# Patient Record
Sex: Female | Born: 1980 | Hispanic: Yes | Marital: Married | Smoking: Never smoker
Health system: Southern US, Community
[De-identification: ages and names within clinical notes are randomized; demographics above are authoritative.]

---

## 2013-12-19 ENCOUNTER — Encounter (HOSPITAL_COMMUNITY): Payer: Self-pay | Admitting: Emergency Medicine

## 2013-12-19 ENCOUNTER — Emergency Department (HOSPITAL_COMMUNITY)
Admission: EM | Admit: 2013-12-19 | Discharge: 2013-12-19 | Disposition: A | Discharge status: Home / Self Care | Payer: Medicaid - Out of State | Attending: Emergency Medicine | Admitting: Emergency Medicine

## 2013-12-19 ENCOUNTER — Emergency Department (HOSPITAL_COMMUNITY): Payer: Medicaid - Out of State

## 2013-12-19 DIAGNOSIS — R740 Nonspecific elevation of levels of transaminase and lactic acid dehydrogenase [LDH]: Secondary | ICD-10-CM

## 2013-12-19 DIAGNOSIS — R748 Abnormal levels of other serum enzymes: Secondary | ICD-10-CM | POA: Insufficient documentation

## 2013-12-19 DIAGNOSIS — K8020 Calculus of gallbladder without cholecystitis without obstruction: Secondary | ICD-10-CM | POA: Insufficient documentation

## 2013-12-19 DIAGNOSIS — R7401 Elevation of levels of liver transaminase levels: Secondary | ICD-10-CM | POA: Insufficient documentation

## 2013-12-19 DIAGNOSIS — R7402 Elevation of levels of lactic acid dehydrogenase (LDH): Secondary | ICD-10-CM | POA: Insufficient documentation

## 2013-12-19 LAB — COMPREHENSIVE METABOLIC PANEL
ALK PHOS: 106 U/L (ref 39–117)
ALT: 246 U/L — AB (ref 0–35)
AST: 404 U/L — ABNORMAL HIGH (ref 0–37)
Albumin: 3.9 g/dL (ref 3.5–5.2)
BUN: 13 mg/dL (ref 6–23)
CALCIUM: 9.8 mg/dL (ref 8.4–10.5)
CO2: 24 mEq/L (ref 19–32)
Chloride: 101 mEq/L (ref 96–112)
Creatinine, Ser: 0.65 mg/dL (ref 0.50–1.10)
GFR calc non Af Amer: >90 mL/min (ref >90)
GLUCOSE: 143 mg/dL — AB (ref 70–99)
POTASSIUM: 3.9 meq/L (ref 3.7–5.3)
Sodium: 139 mEq/L (ref 137–147)
Total Bilirubin: 0.5 mg/dL (ref 0.3–1.2)
Total Protein: 7.6 g/dL (ref 6.0–8.3)

## 2013-12-19 LAB — CBC WITH DIFFERENTIAL/PLATELET
BASOS ABS: 0 10*3/uL (ref 0.0–0.1)
BASOS PCT: 0 % (ref 0–1)
EOS PCT: 2 % (ref 0–5)
Eosinophils Absolute: 0.1 10*3/uL (ref 0.0–0.7)
HEMATOCRIT: 31.5 % — AB (ref 36.0–46.0)
HEMOGLOBIN: 9.9 g/dL — AB (ref 12.0–15.0)
Lymphocytes Relative: 26 % (ref 12–46)
Lymphs Abs: 1.4 10*3/uL (ref 0.7–4.0)
MCH: 26 pg (ref 26.0–34.0)
MCHC: 31.4 g/dL (ref 30.0–36.0)
MCV: 82.7 fL (ref 78.0–100.0)
Monocytes Absolute: 0.4 10*3/uL (ref 0.1–1.0)
Monocytes Relative: 8 % (ref 3–12)
Neutro Abs: 3.5 10*3/uL (ref 1.7–7.7)
Neutrophils Relative %: 64 % (ref 43–77)
Platelets: 188 10*3/uL (ref 150–400)
RBC: 3.81 MIL/uL — ABNORMAL LOW (ref 3.87–5.11)
RDW: 17.5 % — AB (ref 11.5–15.5)
WBC: 5.4 10*3/uL (ref 4.0–10.5)

## 2013-12-19 LAB — URINALYSIS, ROUTINE W REFLEX MICROSCOPIC
BILIRUBIN URINE: NEGATIVE
GLUCOSE, UA: NEGATIVE mg/dL
HGB URINE DIPSTICK: NEGATIVE
Ketones, ur: NEGATIVE mg/dL
Nitrite: POSITIVE — AB
PROTEIN: NEGATIVE mg/dL
Specific Gravity, Urine: 1.02 (ref 1.005–1.030)
UROBILINOGEN UA: 1 mg/dL (ref 0.0–1.0)
pH: 8.5 — ABNORMAL HIGH (ref 5.0–8.0)

## 2013-12-19 LAB — URINE MICROSCOPIC-ADD ON

## 2013-12-19 LAB — LIPASE, BLOOD: Lipase: 90 U/L — ABNORMAL HIGH (ref 11–59)

## 2013-12-19 MED ORDER — ONDANSETRON HCL 4 MG/2ML IJ SOLN
4.0000 mg | Freq: Once | INTRAMUSCULAR | Status: AC
  Administered 2013-12-19: 4 mg via INTRAVENOUS
  Filled 2013-12-19: qty 2

## 2013-12-19 MED ORDER — OXYCODONE HCL 5 MG PO TABS
ORAL_TABLET | ORAL | Status: AC
Start: 2013-12-19 — End: ?

## 2013-12-19 MED ORDER — ONDANSETRON 8 MG PO TBDP: 8.0000 mg | ORAL_TABLET | Freq: Three times a day (TID) | ORAL | Status: AC | PRN

## 2013-12-19 MED ORDER — SODIUM CHLORIDE 0.9 % IV SOLN
Freq: Once | INTRAVENOUS | Status: AC
  Administered 2013-12-19: 05:00:00 via INTRAVENOUS

## 2013-12-19 MED ORDER — MORPHINE SULFATE 4 MG/ML IJ SOLN
4.0000 mg | Freq: Once | INTRAMUSCULAR | Status: AC
  Administered 2013-12-19: 4 mg via INTRAVENOUS
  Filled 2013-12-19: qty 1

## 2013-12-19 NOTE — Discharge Instructions (Signed)
Gallbladder  Take medications as prescribed.  Call the surgery clinic later today to schedule a follow up visit for your gallstones.  Return to the ER for worsening pain, vomiting despite medications, fever, or other new concerning symptoms.   PAIN ACETAMINOPHEN OXYCODONE  PAIN ACETAMINOPHEN OXYCODONE: You have been given a medication that contains acetaminophen and oxycodone.      This medication is used to relieve pain.     DO NOT take this medication if you have liver disease or drink alcohol on a daily basis.     DO NOT take this medication if you are taking other over-the-counter medications that contain Tylenol or acetaminophen (the active ingredient in Tylenol).     If you have side-effects that you think are caused by this medicine, tell your doctor.     DO NOT drink alcoholic beverages while taking this medicine.     If you become dizzy, sit or lie down at the first signs.  You should be careful going up and down stairs.     If you are pregnant or breastfeeding, notify your doctor before taking this medication.     Keep this medication out of the reach of children.  Always keep this medication in child-proof containers.  DO NOT give your medication to anyone else. This medication can be HABIT-FORMING.  Discontinue use when no longer needed and never give this medication to others.  You have been given a medication, or a prescription for a medication, that causes drowsiness or dizziness.  DO NOT drive a car, operate machinery, or perform jobs that require you to be alert until you know how you are going to react to this medicine.  THESE INSTRUCTIONS ARE NOT COMPREHENSIVE (complete):  Ask your pharmacist for additional information and precautions for this medication.   GI ANTIEMETIC  GI ANTIEMETIC: You have been given a prescription for a medication for nausea and vomiting.      It is OK to take this medication if you are pregnant.  Be sure to tell your regular doctor or  obstetrician [facility] doctor) that you have been taking this medication.     Take this medication as directed.     If you are taking phenobarbital, narcotic pain medications, antidepressants, or sleeping pills your dosage may need to be adjusted.  Be sure to inform your doctor of all the other medications that you are taking.     DO NOT take this medication if you have liver disease or heart disease.     DO NOT take pain killers (narcotic medication) unless specifically instructed to do so by your doctor     DO NOT drink alcoholic beverages while taking this medicine.     If you develop any reactions that you believe may be from the medication be sure to tell your doctor or return to the ER (Some reactions may include:  dizziness, shaking, visual disturbances, nervousness, fainting, rash).     If you become dizzy, sit or lie down at the first signs.  You should be careful going up and down stairs.     Keep this medication out of the reach of children.  Always keep this medication in child-proof containers.  DO NOT give your medication to anyone else. You have been given a medication, or a prescription for a medication, that causes drowsiness or dizziness.  DO NOT drive a car, operate machinery, ride a bike, or perform jobs that require you to be alert until you know how you  are going to react to this medication. ° °THESE INSTRUCTIONS ARE NOT COMPREHENSIVE (complete):  Ask your pharmacist for additional information and precautions for this medication. ° ° °LOW FAT DIET ° °Low Fat Diet ° °Your doctor wants you to be on a low fat diet.  This diet will be helpful if you want to lose weight or if you have problems with your liver, pancreas, gallbladder.  °FOODS ALLOWED: ° ·  Beverages: All, except those not allowed.   Evaporated skim milk.  ° ·  Breads: All enriched or whole grain bread, bread sticks, graham crackers, melba toast, pretzels, rye wafers, matzoh, saltines, bagels.  ° ·  Cereals: All cooked  without fat or dry.  ° ·  Desserts: All fruit, diet puddings, gelatin, dessert made with egg white, angel food cake, fruit ice, sherbet.  ° ·  Eggs: Not more than one egg yolk daily, whites okay.  Cholesterol free egg substitutes, such as "egg beaters."  ° ·  Fat: One teaspoon each meal or 3 teaspoons per day; butter, mayonnaise, margarine, oil. (May be used in cooking if omitted at meals) Fat free salad dressings and gravy.  ° ·  Fruits: All fresh, frozen, or canned fruit or fruit juice. One citrus fruit every day.  ° ·  Meats, Fish, Poultry & Cheese: Remove visible fat from meat before cooking. Baked, broiled, boiled, roasted, stewed, simmered; lean fish, meat, poultry, seafood.  Water packed salmon and tuna.  Lowfat cottage cheese, skim milk cheese, ricotta, parmesan, farmer's cheese, lowfat yogurt and tofu.  ° ·  Potatoes & Substitutes: Macaroni, noodles, rice, spaghetti, sweet or white potato; prepared without fat, unless used in amount allowed.  ° ·  Soups: Bouillon, cream soups made with vegetables and skim milk, fat free meat and poultry soups.  ° ·  Sweets: Honey, jam, jelly, marshmallows, molasses, sugar, syrup, candies; hard, Life Savers, gum drops, jelly beans, sour balls.  ° ·  Vegetables: All fresh, frozen, canned or juiced vegetables allowed.  °FOODS NOT ALLOWED: ° ·  Beverages: Cream, 2% or whole milk, chocolate milk, condensed milk, evaporated or malted milk or shakes, 1/2 & 1/2.  ° ·  Breads: Quick breads, muffins, biscuits, pancakes, corn bread, sweet rolls, any fried breads.  ° ·  Cereals: Bran, if it causes distress, wheat germ.  ° ·  Desserts: Dessert made with whole milk, cream, butter, lard, oil, coconut, nuts, or chocolate.  ° ·  Eggs: Eggs prepared with whole milk or fat. Fried eggs.  ° ·  Fat: More than 1 teaspoon per meal.  Bacon, bacon fat, ham fat, lard, salt pork, shortening, gravy, salad dressings, non-dairy creamers.  ° ·  Fruits: [place].  Any fruit that causes distress.   ° ·  Meats, Fish, Poultry & Cheese: All fried or fatty.  Sausage, prime rib, frankfurters, luncheon meats, fish canned in oil, duck, goose, poultry skin, spiced or pickled meats, cheese (except those allowed), whole milk yogurt.  Peanut butter limited to 1 Tbs. day.  ° ·  Potatoes & Substitutes: Cooked with fat or oil, fried potatoes, potato chips, cream sauces (unless made with skim milk).  ° ·  Sweets: Chocolate, coconut, nuts, caramels.  °          ·    Vegetables: Avocado, any cooked in fat or that cause distress.  ° ° °BILIARY COLIC ° °BILIARY COLIC: °You have been diagnosed with biliary colic. ° °Biliary colic is the term used to describe crampy pain from   a gallbladder that contains gallstones. ° °The gallbladder is a small sack that hangs from the liver. It stores a liquid called bile. Bile is produced by the liver. When you eat, the gallbladder squeezes bile through a duct or tube into the intestines to help with the digestion of fat. ° °Gallstones develop from crystals of bile. The stone may be smaller than a pea, or as large as a golf ball. There may be one or several stones. The stone may block the tube that drains the bile from the gallbladder. This blockage causes spasm of the gallbladder and pain.  The pain usually comes and goes and is crampy.  It often starts after eating foods that contain a lot of fat. ° °You may also have nausea and vomiting with the pain. ° °Biliary colic is usually treated with pain medications. You may also be given a medication for the nausea and vomiting. ° °You should avoid eating foods that are fried or contain a lot of fat. ° °If you have more episodes of pain, you may need to have your gall bladder removed. ° ° ·   You should contact your family doctor for a referral to a general surgeon. °YOU SHOULD SEEK MEDICAL ATTENTION IMMEDIATELY, EITHER HERE OR AT THE NEAREST EMERGENCY DEPARTMENT, IF ANY OF THE FOLLOWING OCCURS: ° ° ·   Increasing pain or pain that does not go  away. ° ·   Persistent vomiting or if you are not able to keep any fluids down. ° ·   Fever or shaking chills. ° ·   Yellowing of your skin or eyes, or dark, brown-colored urine. ° °If you develop symptoms of Shortness of Breath, Chest Pain, Swelling of lips, mouth or tongue or if your condition becomes worse with any new symptoms, see your doctor or return to the Emergency Department for immediate care. Emergency services are not intended to be a substitute for comprehensive medical attention.  Please contact your doctor for follow up if not improving as expected.  ° °Call your doctor in 5-7 days or as directed if there is no improvement.  ° °Community Resources: °*IF YOU ARE IN IMMEDIATE DANGER CALL 911! ° °Abuse/Neglect:  °Family Services Crisis Hotline ([place]): (336) 273-7273 °[facility] Violence ([place]): (336) 342-3331 ° After hours, holidays and weekends: (336) 643-3000 °National Domestic Violence Hotline: 800 799-7233 ° °Mental Health: °[place] [facility]: °[address]: (336) 641-4993 ° °Health Clinics:  °[facility] ( [facility]): (336) 832-4400 °Monday - Friday 8 AM - 9 PM, Saturday and Sunday 10 AM - 9 PM ° °[facility] °[address]: ([phone]) [phone] Monday - Friday 8 AM - 5 PM ° °[facility]  °[address]: [phone]) [phone] Monday- Friday 8:30 AM - 5:30 PM, Sat 9 AM - 1 PM ° °[facility] °[facility] [address]: ([phone]) [phone] °[facility] [address]: (336) [phone] °[facility] on [address]: (336) 854-7827 ° °[facility] °[facility]: [address] [phone]) [phone] ° °Cultures: °If culture results are positive, we will notify you if a change in treatment is necessary. ° °LABORATORY TESTS: °        If you had any labs drawn in the ED that have not resulted by the time you are discharged home, we will review these lab results and the treatment given to you.  If there is any further treatment or notification needed,  we will contact you by phone, or letter.  "PLEASE   ENSURE THAT YOU HAVE GIVEN US YOUR CURRENT WORKING PHONE NUMBER AND YOUR CURRENT ADDRESS, so that we can contact you if needed."  RADIOLOGY TESTS:  If the referred physician wants todays x-rays, please call the hospitals Radiology Department the day before your doctors appointment. [facility]     [phone] [facility]   [phone] [facility]     [phone]  Our doctors and staff appreciate your choosing us for your emergency medical care needs. We are here to serve you.   Cholelithiasis Cholelithiasis (also called gallstones) is a form of gallbladder disease in which gallstones form in your gallbladder. The gallbladder is an organ that stores bile made in the liver, which helps digest fats. Gallstones begin as small crystals and slowly grow into stones. Gallstone pain occurs when the gallbladder spasms and a gallstone is blocking the duct. Pain can also occur when a stone passes out of the duct.  RISK FACTORS  Being female.   Having multiple pregnancies. Health care providers sometimes advise removing diseased gallbladders before future pregnancies.   Being obese.  Eating a diet heavy in fried foods and fat.   Being older than 60 years and increasing age.   Prolonged use of medicines containing female hormones.   Having diabetes mellitus.   Rapidly losing weight.   Having a family history of gallstones (heredity).  SYMPTOMS  Nausea.   Vomiting.  Abdominal pain.   Yellowing of the skin (jaundice).   Sudden pain. It may persist from several minutes to several hours.  Fever.   Tenderness to the touch. In some cases, when gallstones do not move into the bile duct, people have no pain or symptoms. These are called "silent" gallstones.  TREATMENT Silent gallstones do not need treatment. In severe cases, emergency surgery may be required. Options for treatment include:  Surgery to remove the gallbladder. This is  the most common treatment.  Medicines. These do not always work and may take 6 12 months or more to work.  Shock wave treatment (extracorporeal biliary lithotripsy). In this treatment an ultrasound machine sends shock waves to the gallbladder to break gallstones into smaller pieces that can pass into the intestines or be dissolved by medicine. HOME CARE INSTRUCTIONS   Only take over-the-counter or prescription medicines for pain, discomfort, or fever as directed by your health care provider.   Follow a low-fat diet until seen again by your health care provider. Fat causes the gallbladder to contract, which can result in pain.   Follow up with your health care provider as directed. Attacks are almost always recurrent and surgery is usually required for permanent treatment.  SEEK IMMEDIATE MEDICAL CARE IF:   Your pain increases and is not controlled by medicines.   You have a fever or persistent symptoms for more than 2 3 days.   You have a fever and your symptoms suddenly get worse.   You have persistent nausea and vomiting.  MAKE SURE YOU:   Understand these instructions.  Will watch your condition.  Will get help right away if you are not doing well or get worse. Document Released: 08/25/2005 Document Revised: 05/01/2013 Document Reviewed: 02/20/2013 [facility] Patient Information 2014 [employer] [place].

## 2013-12-19 NOTE — ED Notes (Signed)
Pt reports RUQ pain that started yesterday and has gotten worse. Pt reports n/vx3. Pt states she was told she has gallstones before. Pt ambulatory and alert in triage. Family in room with pt.

## 2013-12-19 NOTE — ED Provider Notes (Signed)
CSN: [id number]     Arrival date & time 12/19/13  4098 History   First MD Initiated Contact with Patient 12/19/13 0357     Chief Complaint  Patient presents with  . Abdominal Pain     (Consider location/radiation/quality/duration/timing/severity/associated sxs/prior Treatment) HPI 33 year old female presents to emergency department with complaint of right upper quadrant pain.  Patient reports pain started earlier today, after lunch.  She reports that she had a large meal of barbecue.  Pain is in right upper quadrant and radiates to her right shoulder blade.  She has had nausea and vomiting with it.  Patient was diagnosed about a year and a half ago with gallstones, and has not had any problems since that time.  She does report this past Friday, 6 days ago.  She had one hour of severe right upper quadrant pain that resolved on its own.  Patient lives in [place], reports that she plans to return there tomorrow.  She reports she has a doctor there that she is planning to followup with.  She denies any fever or chills. History reviewed. No pertinent past medical history. History reviewed. No pertinent past surgical history. No family history on file. History  Substance Use Topics  . Smoking status: Never Smoker   . Smokeless tobacco: Not on file  . Alcohol Use: Not on file   OB History   Grav Para Term Preterm Abortions TAB SAB Ect Mult Living                 Review of Systems  See History of Present Illness; otherwise all other systems are reviewed and negative   Allergies  Review of patient's allergies indicates no known allergies.  Home Medications  No current outpatient prescriptions on file. BP 133/93  Pulse 87  Temp(Src) 97.5 F (36.4 C) (Oral)  Resp 20  SpO2 98%  LMP 11/18/2013 Physical Exam  Nursing note and vitals reviewed. Constitutional: She is oriented to person, place, and time. She appears well-developed and well-nourished. She appears distressed.  HENT:   Head: Normocephalic and atraumatic.  Nose: Nose normal.  Mouth/Throat: Oropharynx is clear and moist.  Eyes: Conjunctivae and EOM are normal. Pupils are equal, round, and reactive to light.  Neck: Normal range of motion. Neck supple. No JVD present. No tracheal deviation present. No thyromegaly present.  Cardiovascular: Normal rate, regular rhythm, normal heart sounds and intact distal pulses.  Exam reveals no gallop and no friction rub.   No murmur heard. Pulmonary/Chest: Effort normal and breath sounds normal. No stridor. No respiratory distress. She has no wheezes. She has no rales. She exhibits no tenderness.  Abdominal: Soft. Bowel sounds are normal. She exhibits no distension and no mass. There is tenderness (tenderness in right upper quadrant with Murphy sign noted). There is no rebound and no guarding.  Musculoskeletal: Normal range of motion. She exhibits no edema and no tenderness.  Lymphadenopathy:    She has no cervical adenopathy.  Neurological: She is alert and oriented to person, place, and time. She has normal reflexes. No cranial nerve deficit. She exhibits normal muscle tone. Coordination normal.  Skin: Skin is warm and dry. No rash noted. No erythema. No pallor.  Psychiatric: She has a normal mood and affect. Her behavior is normal. Judgment and thought content normal.    ED Course  Procedures (including critical care time) Labs Review Labs Reviewed  CBC WITH DIFFERENTIAL - Abnormal; Notable for the following:    RBC 3.81 (*)  Hemoglobin 9.9 (*)    HCT 31.5 (*)    RDW 17.5 (*)    All other components within normal limits  LIPASE, BLOOD - Abnormal; Notable for the following:    Lipase 90 (*)    All other components within normal limits  URINALYSIS, ROUTINE W REFLEX MICROSCOPIC - Abnormal; Notable for the following:    Color, Urine ORANGE (*)    APPearance TURBID (*)    pH 8.5 (*)    Nitrite POSITIVE (*)    Leukocytes, UA SMALL (*)    All other components  within normal limits  COMPREHENSIVE METABOLIC PANEL - Abnormal; Notable for the following:    Glucose, Bld 143 (*)    AST 404 (*)    ALT 246 (*)    All other components within normal limits  URINE MICROSCOPIC-ADD ON - Abnormal; Notable for the following:    Bacteria, UA FEW (*)    All other components within normal limits  .now Imaging Review [place] Abdomen Complete  12/19/2013   CLINICAL DATA:  Epigastric pain.  EXAM: ULTRASOUND ABDOMEN COMPLETE  COMPARISON:  None.  FINDINGS: Gallbladder:  There are numerous shadowing gallstones. The wall is mildly thickened, up to 4 mm. There is however no focal gallbladder tenderness (per sonographer exam) or gallbladder distention to suggest cystic duct obstruction. Mild wall thickening could be related to chronic cholecystitis or under distention.  Common bile duct:  Diameter: 6 mm.  Where visualized, no filling defect.  Liver:  No focal lesion identified. Within normal limits in parenchymal echogenicity.  IVC:  No abnormality visualized.  Pancreas:  Visualized portion unremarkable.  Spleen:  Size and appearance within normal limits.  Right Kidney:  Length: 11 cm. Echogenicity within normal limits. No mass or hydronephrosis visualized.  Left Kidney:  Length: 12 cm. Echogenicity within normal limits. No mass or hydronephrosis visualized.  Abdominal aorta:  No aneurysm visualized.  Other findings:  None.  IMPRESSION: Cholelithiasis.  Negative for acute cholecystitis.   Electronically Signed   By: Tiburcio PeaJonathan  Watts M.D.   On: 12/19/2013 07:04     EKG Interpretation None      MDM   Final diagnoses:  Cholelithiasis  Elevated transaminase level  Elevated lipase    33 year old female with known gallstones with what appears to be biliary colic.  Concern for possible cholecystitis, however, so we'll get ultrasound to check for gallbladder wall thickening and pericholecystic fluid.  We'll check labs.  Plan for pain and nausea medicine   7:21 AM Pt reports full  resolution of symptoms.  Labs show mild anemia, no elevated WBC.  She does have elevated lipase and AST/ALT.  U/S with mild thickened gb wall, but no pericholecystic fluid or Murphy sign.  Pt advised given her lab abnormalities she should have semi-emergent surgery here.  She does not wish to stay.  She acknowledges she may worsen.  She is advised to go to the nearest ER if she has recurrent pain, vomiting, fever.  Will give her a copy of her labs and u/s.    Olivia Mackielga M Rajean Desantiago, MD 12/19/13 0730

## 2013-12-21 LAB — URINE CULTURE

## 2015-10-18 IMAGING — US US ABDOMEN COMPLETE
1 series · 14 of 25 positions shown · non-contrast
Comparison: None.

CLINICAL DATA: Epigastric pain.

EXAM:
ULTRASOUND ABDOMEN COMPLETE

[Series 1: us abdomen complete · 0.26mm/px · 49 acquisitions, 14 frames shown]
[im 1/49]
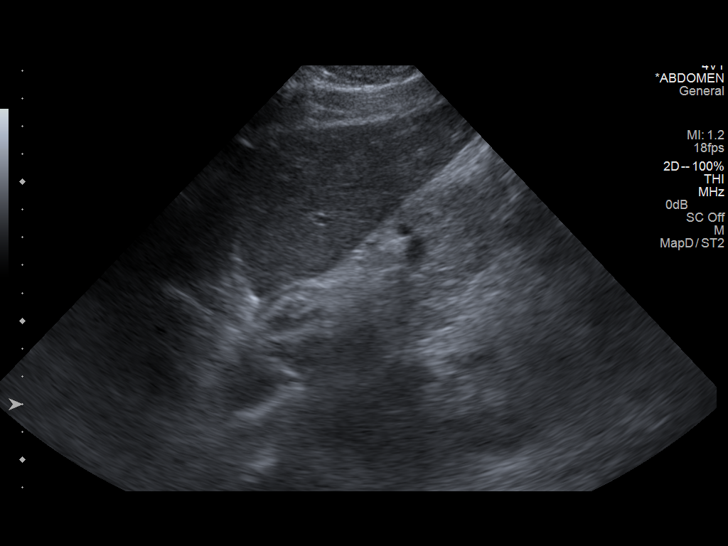
[im 5/49]
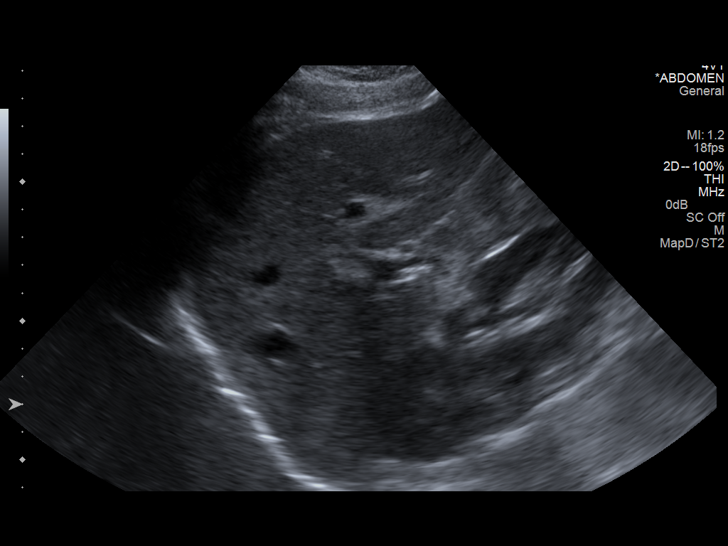
[im 9/49]
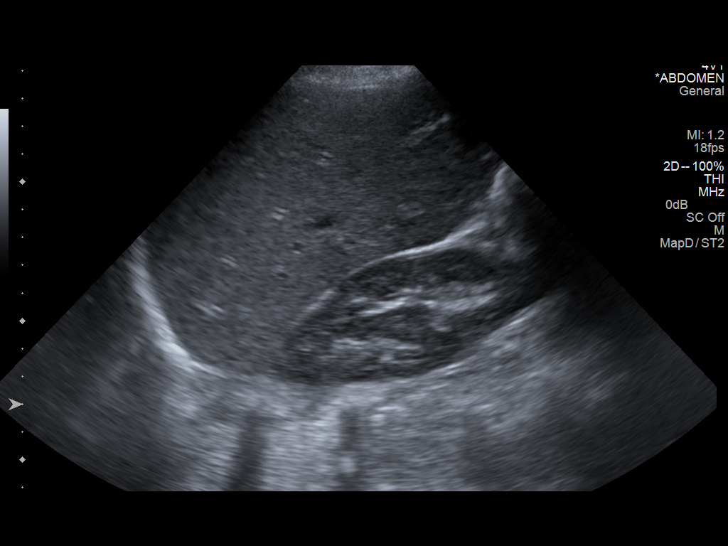
[im 13/49]
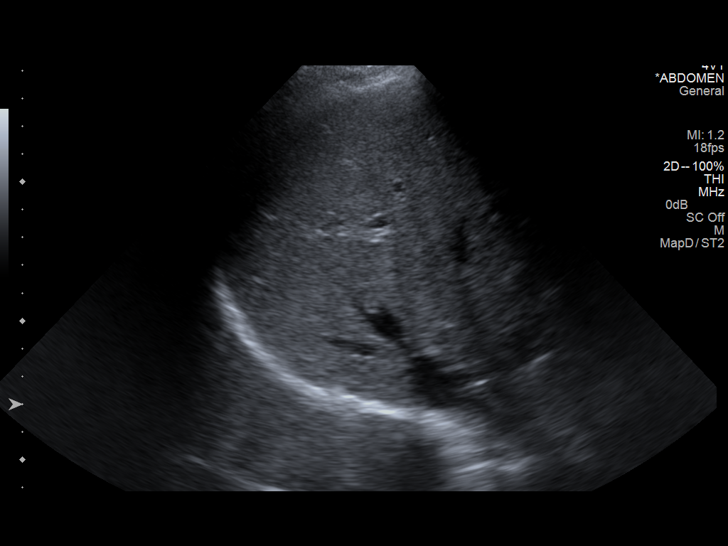
[im 17/49]
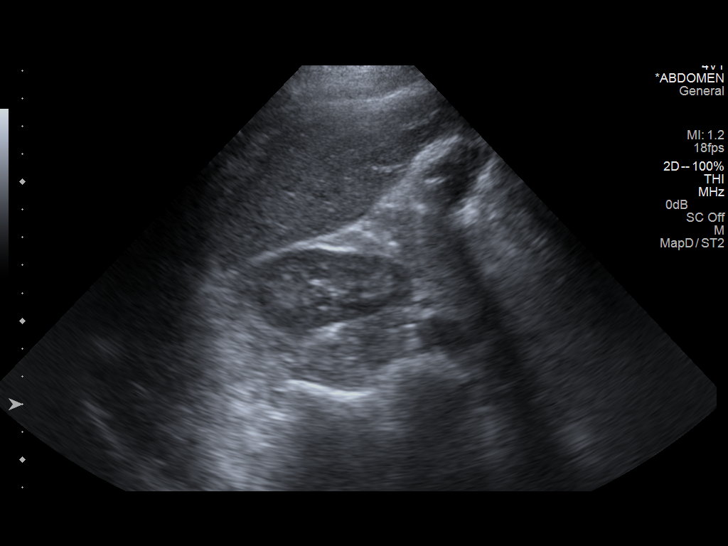
[im 19/49]
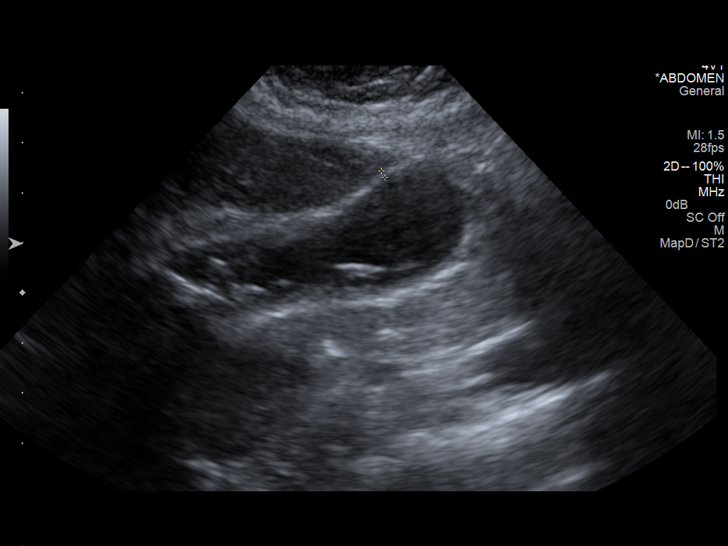
[im 23/49]
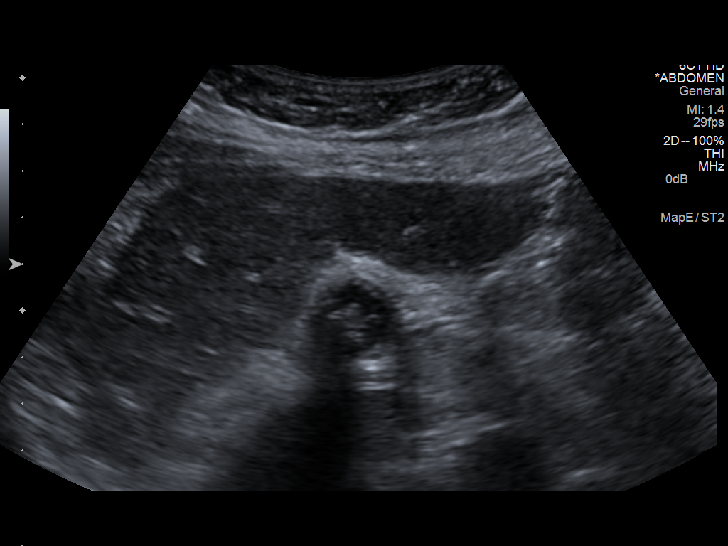
[im 27/49]
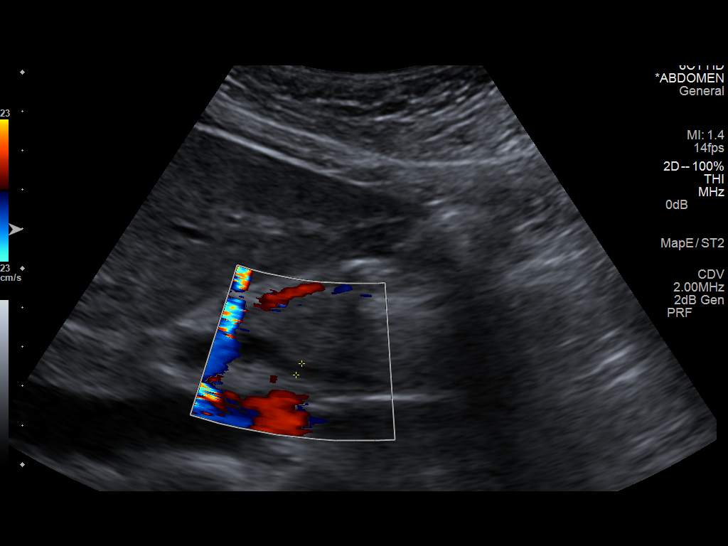
[im 31/49]
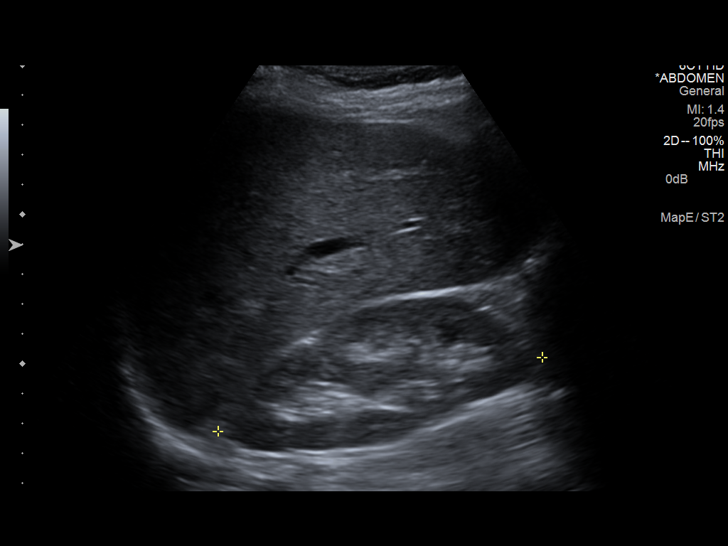
[im 33/49]
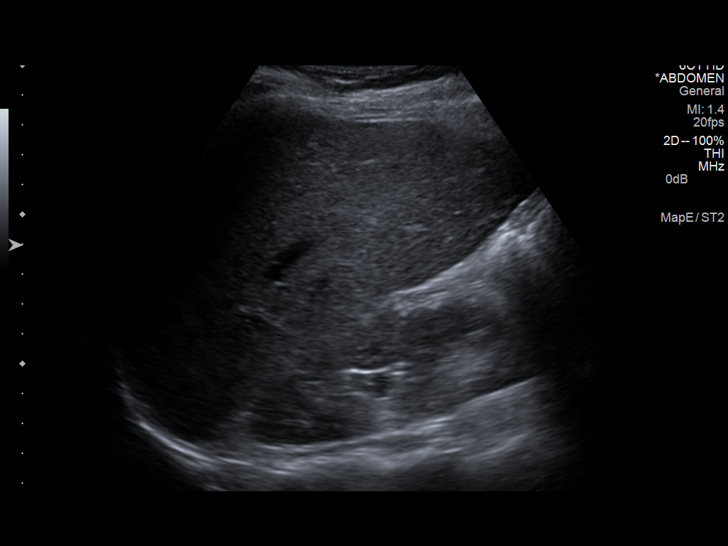
[im 37/49]
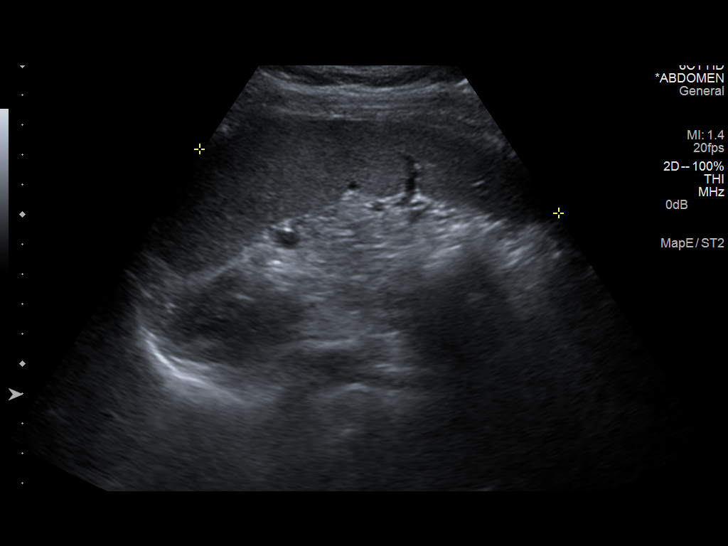
[im 41/49]
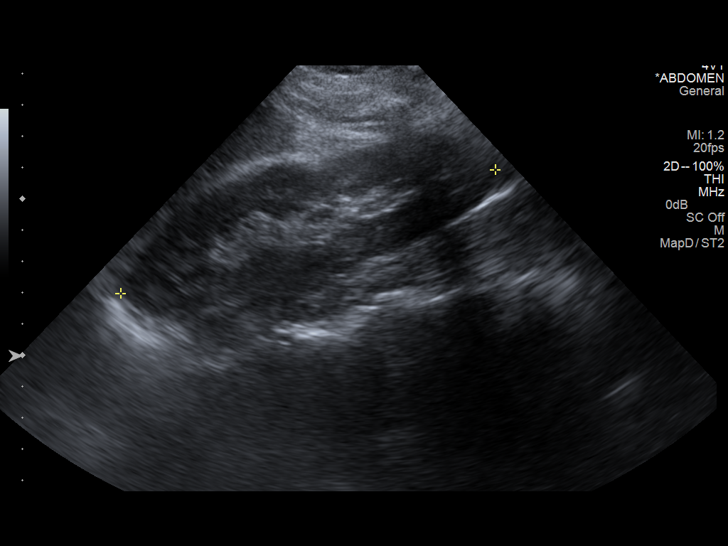
[im 45/49]
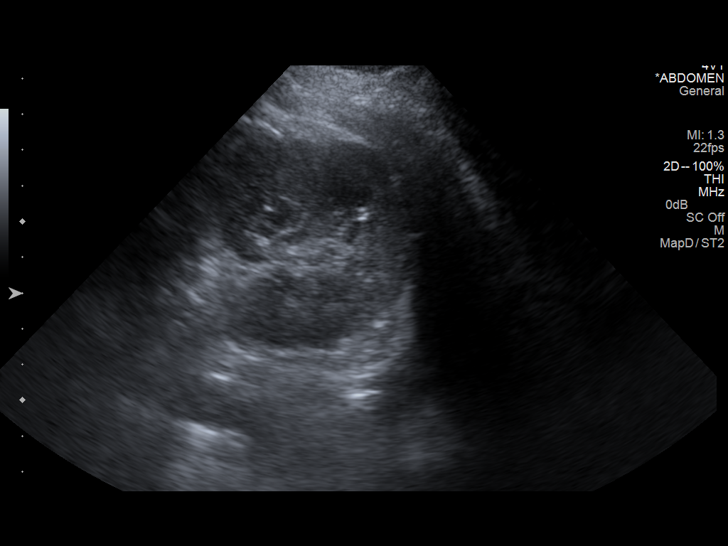
[im 49/49]
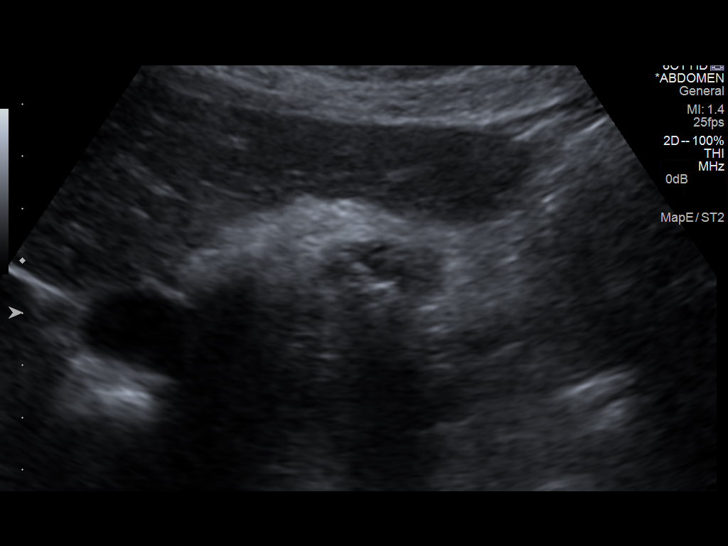

[14 of 25 positions shown; findings below may reference images not displayed]

FINDINGS: Gallbladder:

There are numerous shadowing gallstones. The wall is mildly
thickened, up to 4 mm. There is however no focal gallbladder
tenderness (per sonographer exam) or gallbladder distention to
suggest cystic duct obstruction. Mild wall thickening could be
related to chronic cholecystitis or under distention.

Common bile duct:

Diameter: 6 mm.  Where visualized, no filling defect.

Liver:

No focal lesion identified. Within normal limits in parenchymal
echogenicity.

IVC:

No abnormality visualized.

Pancreas:

Visualized portion unremarkable.

Spleen:

Size and appearance within normal limits.

Right Kidney:

Length: 11 cm. Echogenicity within normal limits. No mass or
hydronephrosis visualized.

Left Kidney:

Length: 12 cm. Echogenicity within normal limits. No mass or
hydronephrosis visualized.

Abdominal aorta:

No aneurysm visualized.

Other findings:

None.
IMPRESSION: Cholelithiasis.  Negative for acute cholecystitis.
